# Patient Record
Sex: Female | Born: 1960 | Race: Black or African American | Hispanic: No | Smoking: Never smoker
Health system: Southern US, Community
[De-identification: ages and names within clinical notes are randomized; demographics above are authoritative.]

## PROBLEM LIST (undated history)

## (undated) HISTORY — PX: ABDOMINAL HYSTERECTOMY: SHX81

---

## 1992-03-12 HISTORY — PX: REPLACEMENT TOTAL KNEE: SUR1224

## 1999-09-05 ENCOUNTER — Encounter: Admission: RE | Admit: 1999-09-05 | Discharge: 1999-09-05 | Payer: Self-pay | Admitting: Family Medicine

## 1999-09-05 ENCOUNTER — Encounter: Payer: Self-pay | Admitting: Family Medicine

## 1999-09-26 ENCOUNTER — Encounter: Admission: RE | Admit: 1999-09-26 | Discharge: 1999-09-26 | Payer: Self-pay | Admitting: Family Medicine

## 1999-09-26 ENCOUNTER — Encounter: Payer: Self-pay | Admitting: Family Medicine

## 2000-12-30 ENCOUNTER — Emergency Department (HOSPITAL_COMMUNITY): Admission: EM | Admit: 2000-12-30 | Discharge: 2000-12-30 | Payer: Self-pay | Admitting: Emergency Medicine

## 2000-12-30 ENCOUNTER — Encounter: Payer: Self-pay | Admitting: Emergency Medicine

## 2001-03-28 ENCOUNTER — Other Ambulatory Visit: Admission: RE | Admit: 2001-03-28 | Discharge: 2001-03-28 | Payer: Self-pay | Admitting: Family Medicine

## 2001-04-15 ENCOUNTER — Encounter: Payer: Self-pay | Admitting: Family Medicine

## 2001-04-15 ENCOUNTER — Ambulatory Visit (HOSPITAL_COMMUNITY): Admission: RE | Admit: 2001-04-15 | Discharge: 2001-04-15 | Payer: Self-pay | Admitting: Family Medicine

## 2002-05-25 ENCOUNTER — Ambulatory Visit (HOSPITAL_COMMUNITY): Admission: RE | Admit: 2002-05-25 | Discharge: 2002-05-25 | Payer: Self-pay

## 2002-05-29 ENCOUNTER — Encounter: Admission: RE | Admit: 2002-05-29 | Discharge: 2002-05-29 | Payer: Self-pay

## 2002-07-17 ENCOUNTER — Encounter (INDEPENDENT_AMBULATORY_CARE_PROVIDER_SITE_OTHER): Payer: Self-pay | Admitting: *Deleted

## 2002-07-17 ENCOUNTER — Ambulatory Visit (HOSPITAL_COMMUNITY): Admission: RE | Admit: 2002-07-17 | Discharge: 2002-07-17 | Payer: Self-pay

## 2002-07-28 ENCOUNTER — Inpatient Hospital Stay (HOSPITAL_COMMUNITY): Admission: AD | Admit: 2002-07-28 | Discharge: 2002-07-28 | Payer: Self-pay

## 2002-08-26 ENCOUNTER — Encounter (INDEPENDENT_AMBULATORY_CARE_PROVIDER_SITE_OTHER): Payer: Self-pay | Admitting: *Deleted

## 2002-08-27 ENCOUNTER — Inpatient Hospital Stay (HOSPITAL_COMMUNITY): Admission: AD | Admit: 2002-08-27 | Discharge: 2002-08-28 | Payer: Self-pay

## 2002-09-05 ENCOUNTER — Inpatient Hospital Stay (HOSPITAL_COMMUNITY): Admission: AD | Admit: 2002-09-05 | Discharge: 2002-09-05 | Payer: Self-pay

## 2002-12-30 ENCOUNTER — Other Ambulatory Visit: Admission: RE | Admit: 2002-12-30 | Discharge: 2002-12-30 | Payer: Self-pay | Admitting: Obstetrics and Gynecology

## 2005-08-02 ENCOUNTER — Emergency Department (HOSPITAL_COMMUNITY): Admission: EM | Admit: 2005-08-02 | Discharge: 2005-08-02 | Payer: Self-pay | Admitting: Family Medicine

## 2005-11-08 ENCOUNTER — Ambulatory Visit (HOSPITAL_COMMUNITY): Admission: RE | Admit: 2005-11-08 | Discharge: 2005-11-08 | Payer: Self-pay | Admitting: Chiropractic Medicine

## 2005-12-18 ENCOUNTER — Encounter: Admission: RE | Admit: 2005-12-18 | Discharge: 2005-12-18 | Payer: Self-pay | Admitting: Internal Medicine

## 2006-01-30 ENCOUNTER — Encounter (INDEPENDENT_AMBULATORY_CARE_PROVIDER_SITE_OTHER): Payer: Self-pay | Admitting: Specialist

## 2006-01-30 ENCOUNTER — Ambulatory Visit (HOSPITAL_COMMUNITY): Admission: RE | Admit: 2006-01-30 | Discharge: 2006-01-30 | Payer: Self-pay | Admitting: Obstetrics and Gynecology

## 2006-06-17 ENCOUNTER — Ambulatory Visit (HOSPITAL_COMMUNITY): Admission: RE | Admit: 2006-06-17 | Discharge: 2006-06-17 | Payer: Self-pay | Admitting: Chiropractic Medicine

## 2006-07-28 ENCOUNTER — Inpatient Hospital Stay (HOSPITAL_COMMUNITY): Admission: AD | Admit: 2006-07-28 | Discharge: 2006-07-28 | Payer: Self-pay | Admitting: Obstetrics and Gynecology

## 2006-08-06 ENCOUNTER — Ambulatory Visit: Admission: RE | Admit: 2006-08-06 | Discharge: 2006-08-06 | Payer: Self-pay | Admitting: Gynecologic Oncology

## 2006-08-13 ENCOUNTER — Ambulatory Visit (HOSPITAL_COMMUNITY): Admission: RE | Admit: 2006-08-13 | Discharge: 2006-08-13 | Payer: Self-pay | Admitting: Gynecologic Oncology

## 2006-08-13 ENCOUNTER — Encounter: Payer: Self-pay | Admitting: Gynecologic Oncology

## 2006-08-17 ENCOUNTER — Inpatient Hospital Stay (HOSPITAL_COMMUNITY): Admission: AD | Admit: 2006-08-17 | Discharge: 2006-08-17 | Payer: Self-pay | Admitting: Obstetrics & Gynecology

## 2006-08-27 ENCOUNTER — Ambulatory Visit: Admission: RE | Admit: 2006-08-27 | Discharge: 2006-08-27 | Payer: Self-pay | Admitting: Gynecologic Oncology

## 2006-09-23 ENCOUNTER — Emergency Department (HOSPITAL_COMMUNITY): Admission: EM | Admit: 2006-09-23 | Discharge: 2006-09-24 | Payer: Self-pay | Admitting: Emergency Medicine

## 2007-01-01 ENCOUNTER — Encounter: Admission: RE | Admit: 2007-01-01 | Discharge: 2007-01-01 | Payer: Self-pay | Admitting: Internal Medicine

## 2007-06-03 ENCOUNTER — Emergency Department (HOSPITAL_COMMUNITY): Admission: EM | Admit: 2007-06-03 | Discharge: 2007-06-03 | Payer: Self-pay | Admitting: Emergency Medicine

## 2007-07-08 ENCOUNTER — Encounter: Admission: RE | Admit: 2007-07-08 | Discharge: 2007-07-08 | Payer: Self-pay | Admitting: Internal Medicine

## 2009-05-03 ENCOUNTER — Emergency Department (HOSPITAL_COMMUNITY): Admission: EM | Admit: 2009-05-03 | Discharge: 2009-05-03 | Payer: Self-pay | Admitting: Emergency Medicine

## 2010-07-25 NOTE — Consult Note (Signed)
NAMETESSIA, KASSIN NO.:  [id number]   MEDICAL RECORD NO.:  [id number]          PATIENT TYPE:  OUT   LOCATION:  GYN                          FACILITY:  [facility]   PHYSICIAN:  Paola A. Duard Brady, MD    DATE OF BIRTH:  1960-04-07   DATE OF CONSULTATION:  08/27/2006  DATE OF DISCHARGE:                                 CONSULTATION   Krystal Larson is a 50 year old who was seen in May 2008 by Dr. Cherly Hensen.  At  that time she had a pelvic fullness and tenderness on the right side.  She had an MRI that revealed a 6 x 3 cm ovarian cyst with internal  septations.  Ultrasound in May revealed a 10 x 7 x 6 cm complex right  ovarian mass with a normal CA-125 and CEA.  She was initially seen by [place]  Aug 06, 2006.  On August 13, 2006, she underwent diagnostic laparoscopy and  extensive lysis of adhesions and a right salpingo-oophorectomy.  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  The mass measured approximately 10 cm and extended from sidewall  to sidewall.  The mass was already ruptured at the time a of laparoscopy  as there was hemorrhagic fluid within the pelvis.  Final pathology was  consistent with extensive infarction with hemorrhage felt to be  consistent with torsion.  She comes in today for her postoperative  check.  She states that she is feeling better now than she did before  the surgery.  Her pain is much less now than it was preoperatively and  she is overall doing quite well.  Her energy level is slowly improving.  She denies any nausea or vomiting.  She has return of bowel and bladder  function.   PHYSICAL EXAMINATION:  VITAL SIGNS:  Weight 186 pounds, which is down 5  pounds, blood pressure 122/84.  GENERAL:  Well-nourished, well-developed female in no acute distress.  ABDOMEN:  She has well-healed laparoscopy skin incisions.  Abdomen is  soft and nontender.   ASSESSMENT:  49. A 50 year old status post diagnostic laparoscopy, lysis of      adhesions and right salpingo-oophorectomy for a torsed ovary.  On      the comments of the pathology section, it reveals extensively      infarcted tissue with associated acute inflammation and prominent      hemorrhage.  Given the extensive infarction, the nature of the      process was unclear and they cannot rule out a neoplastic process.      These nebulous findings were discussed with the patient and her      pathology will be sent to [facility] for review, and I will      contact the patient with that review once it is available.  She      will follow up with Dr. Cherly Hensen for routine GYN care.  2. I did speak to Krystal Larson regarding hormone replacement therapy.      She is having mild  vasomotor symptoms and minimal night sweats.      She does not wish to proceed with hormone replacement therapy at      this time.  I did discuss the risks of premature menopause with her      including but not limited to osteopenia and osteoporosis.  She will      discuss this with her primary care physician, and I have      recommended that she have a bone density      study in a year with follow-up and recommendations pending that.      She will be released from our clinic.  She knows that this was not      a malignant process and is very pleased with that.  She will return      to see [place] on a p.r.n. basis.      Paola A. Duard Brady, MD  Electronically Signed     PAG/MEDQ  D:  08/27/2006  T:  08/28/2006  Job:  [id number]   cc:   Maxie Better, M.D.  Fax: [phone]   Telford Nab, R.N.  [address]  [place], [place] [address]   Merlene Laughter. Renae Gloss, M.D.  Fax: [phone]

## 2010-07-25 NOTE — Op Note (Signed)
NAMEKHYRA, VISCUSO NO.:  [id number]   MEDICAL RECORD NO.:  [id number]          PATIENT TYPE:  AMB   LOCATION:  DAY                          FACILITY:  [facility]   PHYSICIAN:  Paola A. Duard Brady, MD    DATE OF BIRTH:  February 16, 1961   DATE OF PROCEDURE:  08/13/2006  DATE OF DISCHARGE:                               OPERATIVE REPORT   PREOPERATIVE DIAGNOSIS:  Persistent right ovarian mass measuring 10 cm.   POSTOPERATIVE DIAGNOSIS:  Persistent right ovarian mass measuring 10 cm.   PROCEDURE:  Diagnostic laparoscopy, lysis of adhesions, right salpingo-  oophorectomy.   SURGEON:  Paola A. Duard Brady, M.D.  Roseanna Rainbow, M.D.   ANESTHESIA:  General.   ESTIMATED BLOOD LOSS:  Less than 100 mL.   URINE OUTPUT:  300 mL.   IV FLUIDS:  1600 mL.   COMPLICATIONS:  None.   SPECIMENS TO PATHOLOGY:  Washings, right tube and ovary.   COMPLICATIONS:  None.   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  The mass measured approximately 10 cm and extended from sidewall  to sidewall.  The mass was already ruptured at the time of laparoscopy.   DESCRIPTION OF PROCEDURE:  The patient was taken to the operating room  and placed in supine position where her arms were tucked at her side  with appropriate precautions to her comfort level. General anesthesia  was then induced.  She was then placed in the dorsal lithotomy position  with all appropriate precautions being taken.  Shoulder blocks were  placed appropriately. The abdomen was prepped in the usual sterile  fashion.  The perineum and vagina were prepped in the usual fashion.  A  Foley catheter was inserted into the bladder under sterile conditions.  A Ray-Tec on a sponge stick was placed in the vagina.  The patient was  then draped in the usual sterile fashion.  A time out was performed to  confirm the patient,  surgeons, and allergies.  0.25% Marcaine was  injected for analgesia.   A transverse infraumbilical incision was made in the skin at an area of  prior incision.  The fascia was cleared and there was an obvious  infraumbilical hernia.  The Apollo port was then placed through it.  The  abdomen was insufflated with CO2 gas.  At this point, and at multiple  points during the case, peak pressures were checked and never did  increase over 15 mmHg.  The patient was then placed in Trendelenburg  position with the findings as above.  Bilateral 5 mm ports were placed  under direct visualization.  A 10/12 suprapubic port was paced in the  usual sterile fashion.  Our attention was first drawn to using a suction  irrigator to remove a significant amount of blood and clot already  present within the pelvis.  We then freed the rectosigmoid colon that  was overlying the mass using sharp and blunt dissection as it peeled off  the rectosigmoid colon.   Attention  was then drawn to the right pelvic sidewall.  The round  ligament was transected and the posterior leaf of the broad ligament was  opened.  The ureter was identified.  A window was made between the IP  and the ureter.  The IP was coagulated with bipolar cautery.  We then  freed the mass from its adhesive disease to the right pelvic sidewall  using sharp dissection.  The cul-de-sac was noted to be free and we were  allowed to circumferentially free up the mass from the anterior bladder  and the left pelvic sidewall and the rectosigmoid colon, again using  sharp dissection.  The mass was then elevated out of the pelvis and the  filmy adhesions to the left pelvic sidewall were taken down using blunt  dissection.  The mass was placed in an EndoCatch bag.  The mass  delivered through the 10/12 port.   The abdomen and pelvis were copiously irrigated with a liter of saline.  Secondary to prior significant adhesive disease, Seprafilm emulsion was   placed in the pelvis.  Lysis of adhesions took one hour.  All pedicles  were inspected under low flow and low pressure.  The ports were removed  under direct visualization.  The fascia of the infraumbilical port was  closed using a running UR6 of 0 Vicryl.  The skin was closed using 4-0  subcuticular.  Steri-Strips were applied.  The sponge stick was removed.   The patient tolerated the procedure well and was taken to the recovery  room in stable condition.  All instrument, needle and Ray-Tec counts  were correct x2.      Paola A. Duard Brady, MD  Electronically Signed     PAG/MEDQ  D:  08/13/2006  T:  08/13/2006  Job:  [id number]   cc:   Maxie Better, M.D.  Fax: [phone]   Telford Nab, R.N.  [address]  [place], [place] [address]   Merlene Laughter. Renae Gloss, M.D.  Fax: [phone]

## 2010-07-25 NOTE — Consult Note (Signed)
Krystal Larson, Krystal Larson NO.:  [id number]   MEDICAL RECORD NO.:  [id number]          PATIENT TYPE:  OUT   LOCATION:  GYN                          FACILITY:  [facility]   PHYSICIAN:  Paola A. Duard Brady, MD    DATE OF BIRTH:  05/11/1960   DATE OF CONSULTATION:  08/06/2006  DATE OF DISCHARGE:                                 CONSULTATION   PRIMARY PHYSICIAN:  Dr. Kellie Shropshire at [facility].   The patient is seen today in consultation at the request of Dr. Cherly Hensen.  Krystal Larson is a 50 year old gravida 4, para 4 who about 4-5 weeks ago  began having some slight right lower quadrant pain.  Last week the pain  increased significantly.  She was seen on Jul 28, 2006 by Dr. Cherly Hensen.  At that time an ultrasound was performed that revealed fullness in the  right side which is tender about 6 cm.  She had an MRI in April that  revealed a 6 x 3 centimeter right ovarian cyst with internal septation.  She had an ultrasound in May that just showed a 10 x 7 x 6 cm complex  right ovarian cyst with a minimal amount of free fluid.  CA-125 was done  and was 5.7, AST was 1.3 and a CEA was 0.5.  It is for this reason that  she is referred to [place] today.  Since that past Sunday her pain has  decreased with ibuprofen and Dilaudid.  She stopped taking the Dilaudid  as it was causing her to have some constipation and her pain is well  managed the ibuprofen alone.  She is eating well.  Denies any fevers,  chills, any nausea or vomiting.   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  She is  undergoing physical therapy.   PAST SURGICAL HISTORY:  In June of 2004 she had a vaginal hysterectomy  for fibroids.  In November of 2007 she had a diagnostic laparoscopy,  LSO, aspiration of a right ovarian cyst, lysis of adhesions for pelvic  pain.  She had right knee surgery 15 years ago.  She does not take  antibiotics.  She has had four vaginal deliveries.   MEDICATIONS:  Ibuprofen  p.r.n.   ALLERGIES:  NONE.   FAMILY HISTORY:  She has a brother with diabetes.  She has two sisters  and two brothers with hypertension as do her parents.  She has two  maternal aunts with ovarian cancer, one in her 58s, one in her 68s.  The  history is somewhat ill defined.  She has had two maternal cousins with  cervical cancer in their 30s.   HEALTH MAINTENANCE:  She is up-to-date on her mammograms with the last  one being in October of 2007.   SOCIAL HISTORY:  She denies the use tobacco or alcohol.  She works in  [employer] of the [employer].   PHYSICAL EXAMINATION:  VITAL SIGNS:  Weight 191 pounds, height 5 feet 4  inches, blood pressure 118/84.  GENERAL:  Well-nourished, alert female  in no acute distress.  NECK:  Supple.  There is no lymphadenopathy, no thyromegaly.  LUNGS:  Were clear to auscultation bilaterally.  CARDIOVASCULAR EXAM:  Regular rate and rhythm.  ABDOMEN:  Is morbidly obese, soft, nontender, nondistended.  No palpable  masses or hepatosplenomegaly.  GROINS:  Are negative for adenopathy.  EXTREMITIES:  There is no edema.  PELVIC:  External genitalia is within normal limits.  Bimanual  examination reveals an 8 cm mass at the vaginal cuff.  There is no  nodularity.   ASSESSMENT:  A 50 year old with persistent right ovarian mass that was  noted when she had her laparoscopic left salpingo-oophorectomy in  November of 2007 and is persistent through this time and increasing in  size.   PLAN:  Discussed operative laparoscopy with the patient.  Risks and  benefits of surgery were discussed with the patient.  Due to her pain  she wishes to proceed.  Risks including bleeding, infection, injury to  surrounding organs were discussed with the patient.  She also  understands that she will be menopausal at that time and there will be  the need of hormone replacement therapy.  Hormone replacement therapy  risks and benefits were discussed with the  patient as well.  We also  discussed that we would start with diagnostic laparoscopy.  She  understands the need for a possible laparotomy either via Pfannenstiel  or a vertical midline incision pending the operative findings and the  amount of adhesive disease.  In addition, we discussed trying to  coordinate the surgery with Dr. Cherly Hensen' schedule, though if Dr.  Cherly Hensen' schedule and our schedule does not allow [place] to perform a  coordinated surgery we will seek the assistance of another OB/GYN in the  community to help during the operative laparoscopy.  Her questions were  elicited and answered to her satisfaction.  She is very pleased with  today's consultation.      Paola A. Duard Brady, MD  Electronically Signed     PAG/MEDQ  D:  08/06/2006  T:  08/06/2006  Job:  [id number]   cc:   Maxie Better, M.D.  Fax: [phone]   Merlene Laughter. Renae Gloss, M.D.  Fax: [phone]   Telford Nab, R.N.  [address]  [place], [place] [address]

## 2010-07-28 NOTE — Discharge Summary (Signed)
   NAME:  CECILIE, [name] NO.:  [id number]   MEDICAL RECORD NO.:  [id number]                   PATIENT TYPE:  INP   LOCATION:  9325                                 FACILITY:  [facility]   PHYSICIAN:  Ronda Fairly. Galen Daft, M.D.              DATE OF BIRTH:  08/24/1960   DATE OF ADMISSION:  08/26/2002  DATE OF DISCHARGE:  08/28/2002                                 DISCHARGE SUMMARY   ADMISSION DIAGNOSIS:  Pelvic pain.   PRINCIPAL DIAGNOSIS:  Pelvic pain.   SECONDARY DIAGNOSIS:  Abnormal uterine bleeding, dysmenorrhea, and  adenomyosis and cervical dysplasia.   COMPLICATIONS THIS HOSPITALIZATION:  None.   CONDITION ON DISCHARGE:  Stable.   HOSPITAL COURSE:  The patient was admitted on the 16th of June for  laparoscopic-assisted vaginal hysterectomy.  She had the month prior failed  laparoscopy and hysteroscopy for pelvic pain.  She had continued pelvic pain  with bleeding despite this and hormone therapy was not effective.  The  patient had a second opinion prior to surgery as well.  The surgery was  carried out without difficulty on the 16th.  It was a laparoscopic-assisted  vaginal hysterectomy.  Ovaries were left in place and not removed.  The  patient did well in the postoperative course and she did have significant  anemia which stabilized prior to discharge.  Her hemoglobin was down in the  6 gram range on postoperative day #2 at 6.6.  She had a final hemoglobin  performed.  The patient wished to be discharged on that day and the final  hemoglobin showed that it was stable and not having any further decrease.  The patient was given discharge instructions regarding activity limits,  wound care, follow up in the office, medication, and she was to follow up in  the office in two weeks.  The patient had discharge medications of iron  tablets and Percocet.  Her final hemoglobin at discharge was 7 grams and  vital signs were stable and afebrile.  Her physical  examination on the day  of discharge was unremarkable.  She was feeling well, tolerating a regular  diet, had normal return of bowel function, and urinating without difficulty.                                               Ronda Fairly. Galen Daft, M.D.    NJT/MEDQ  D:  10/17/2002  T:  10/17/2002  Job:  [id number]

## 2010-07-28 NOTE — Op Note (Signed)
NAME:  Krystal Larson, Krystal Larson NO.:  [id number]   MEDICAL RECORD NO.:  [id number]                   PATIENT TYPE:  AMB   LOCATION:  SDC                                  FACILITY:  [facility]   PHYSICIAN:  Ronda Fairly. Galen Daft, M.D.              DATE OF BIRTH:  Dec 13, 1960   DATE OF PROCEDURE:  08/26/2002  DATE OF DISCHARGE:                                 OPERATIVE REPORT   PREOPERATIVE DIAGNOSES:  1. Pelvic pain.  2. Menometrorrhagia.  3. Cervical intraepithelial neoplasia I.   POSTOPERATIVE DIAGNOSES:  1. Pelvic pain.  2. Menometrorrhagia.  3. Cervical intraepithelial neoplasia I.   PROCEDURE:  Laparoscopically-assisted vaginal hysterectomy.   SURGEON:  Ronda Fairly. Galen Daft, M.D.   ASSISTANT:  Raynald Kemp, M.D.   ANESTHESIA:  General.   COMPLICATIONS:  None.   ESTIMATED BLOOD LOSS:  200 mL.   OPERATIVE REPORT:  The patient was identified as Krystal Larson.  Informed  consent was obtained.  Time out was performed.  Preoperative antibiotics  were given, Betadine prep, sterile technique, bladder catheterized and  emptied.  The first part of the procedure was the laparoscopic part.  It was  a 5 mm in the umbilical site, a 10/12 in the right flank, and a 5 mm in the  left flank.  These were done after carbon dioxide insufflation using Veress  needle and ascertaining that the placement was correct.  There was no trauma  on the entry to the abdomen with any of the trocars.  The upper abdomen had  some omental adhesions.  The lower abdomen was free of adhesions.  The  uterus was slightly enlarged, consistent with possible adenomyosis,  otherwise unremarkable.  There were no lesions on the ovaries.  The utero-  ovarian ligament was coagulated with the tripolar forceps on both right and  left sides after coagulation of the round ligament and development of the  bladder flap.  The utero-ovarian ligament on the right side was vascular and  unable to be transected readily  because of its bulkiness and vascularity;  therefore, I switched to an Endo-GIA vascular staple for securing the blood  supply.  This went very well and was done without difficulty.  Care was  taken to avoid adjacent vital structures, including the ureter and bladder.  The left side was done at the upper pole as well.  the areas were completely  hemostatic, mostly performed from the LAVH standpoint with utero-ovarian  ligament and the earliest portion of the broad ligament/cardinal ligament.  The uterine vessels were not taken from the laparoscopic approach, rather  from the vaginal approach.  The upper poles were freed, and there was no  active bleeding.  The ureters were freely peristalsing and of normal caliber  and separate and distinct from the operative sites.  Next the top was  straight and the surgeons went to the vaginal  approach and the posterior cul-  de-sac was entered without difficulty using sharp Mayo scissors and  retractors where appropriate.  A long weighted speculum was then placed in  and the uterosacral ligaments were clamped, divided, and ligated with 0  Vicryl suture.  These were held for later support.  The anterior cul-de-sac  was entered without difficulty.  The bladder remained without any injury.  The cardinal ligaments and uterine arteries are clamped, X'd with the Heaney  clamp, divided, and ligated with 0 Vicryl.  Another clamp up the cardinal  ligament and broad ligament led to completion of the procedure on either  side.  These also were ligated.  The uterus was then removed with the  cervix.  The posterior vaginal cuff was closed with running locking, and  there was complete hemostasis.  The remainder of the cuff was closed with  the interrupted sutures.  Next this procedure was then completed.  There was  no active bleeding.  The Foley catheter was draining clear urine.  The upper  portion was then performed by reinspection after re-gowning and re-gloving   by the surgeon.  There was no bleeding.  The utero-ovarian ligaments and  vaginal portions of the procedure were completely hemostatic.  The ureters  were normal caliber and freely peristalsing.  There was no evidence of  trauma.  The instruments were then removed under direct visualization and  deflation of carbon dioxide gas.  The 10/12 mm site was closed with the  fascia with a 0 Vicryl suture, followed by closure of the skin with 3-0  Monocryl.  The other two 5 mm sites were closed with 3-0 Monocryl.  There  were no complications from the procedure.  She left the operating room in  stable condition.  All instrument, sponge, and needle counts were correct  throughout the case.                                               Ronda Fairly. Galen Daft, M.D.    NJT/MEDQ  D:  08/26/2002  T:  08/26/2002  Job:  [id number]   cc:   Gretta Arab. Valentina Lucks, M.D.  [address] [place]  [place] [address]  Fax: [phone]

## 2010-07-28 NOTE — Op Note (Signed)
NAME:  Krystal Larson, Krystal Larson NO.:  [id number]   MEDICAL RECORD NO.:  [id number]                   PATIENT TYPE:  AMB   LOCATION:  SDC                                  FACILITY:  [facility]   PHYSICIAN:  Ronda Fairly. Galen Daft, M.D.              DATE OF BIRTH:  29-Jul-1960   DATE OF PROCEDURE:  07/17/2002  DATE OF DISCHARGE:  07/17/2002                                 OPERATIVE REPORT   PREOPERATIVE DIAGNOSIS:  Pelvic pain, abnormal uterine bleeding.   POSTOPERATIVE DIAGNOSIS:  Pelvic pain, abnormal uterine bleeding, suspect  adenomyosis.   PROCEDURE:  Diagnostic laparoscopy and hysteroscopy with biopsy.   SURGEON:  Ronda Fairly. Galen Daft, M.D.   COMPLICATIONS:  None.   ANESTHESIA:  General.   ESTIMATED BLOOD LOSS:  Less than 5 cc.   HISTORY OF PRESENT ILLNESS:  The patient was identified at [facility]. She  received preoperative antibiotics prior to  going to the operating room. The  anesthesia was general. Betadine prep was utilized. The bladder was  catheterized and emptied at the beginning of the procedure.   The uterus was examined under anesthesia and was found to be approximately 6  to 8 week size, otherwise unremarkable, no adnexal masses. The single tooth  tenaculum with Jarcho type tenaculum was utilized for uterine manipulation.   The abdomen was entered at the umbilical site with a Verres needle first and  its position was checked and found to be correct using saline testing. The  abdomen was inflated with CO2 gas. The single trocar 10 mm was placed in the  umbilical site without trauma. Pictures were taken of the upper abdomen  which were unremarkable, the bowel surfaces which were unremarkable and the  uterine surfaces. There was no evidence of endometriosis. Both ovaries were  unremarkable. There was no evidence of problems in the posterior cul-de-sac.  No unusual adhesions. The uterus itself was somewhat globular and enlarged,  otherwise  unremarkable.   This part of the procedure was then completed and showed no specific source  for her pelvic pain other than her possible adenomyosis. The laparoscopy  portion being completed, the skin was infiltrated with 0.25% Marcaine,  1:100,000 epinephrine with a total of  8 cc. The fascia was closed with 0  Vicryl and the skin itself was closed with Dermabond.   The vaginal part of the surgery was then begun after the top was draped. The  hysteroscopy was followed out as follows. First the diagnostic hysteroscope  was utilized. There were some fluffy areas and some possible fibroids.  However, placing the operative hysteroscope in to look for this fibroid has  been an illusion from the smaller scope. The larger scope with its better  fluid flow dynamics was able to show that the cavity just had some  proliferative type endometrium, and this was sampled to exclude any source  of premalignant or  malignant condition. The curet was also utilized prior to  this and the polyp forceps.   There was no active bleeding. The patient tolerated the procedure very well  and left the operating room in stable condition. All sponge, instrument and  needle counts were correct throughout the case. There were no complications.                                                  Ronda Fairly. Galen Daft, M.D.    NJT/MEDQ  D:  07/17/2002  T:  07/20/2002  Job:  [id number]

## 2010-07-28 NOTE — Op Note (Signed)
NAMEJOSEY, Krystal Larson NO.:  [id number]   MEDICAL RECORD NO.:  [id number]          PATIENT TYPE:  AMB   LOCATION:  SDC                           FACILITY:  [facility]   PHYSICIAN:  Krystal Larson, Krystal LarsonDATE OF BIRTH:  [birth year]   DATE OF PROCEDURE:  01/30/2006  DATE OF DISCHARGE:                               OPERATIVE REPORT   PREOPERATIVE DIAGNOSIS:  Chronic left lower quadrant pain.   PROCEDURE:  Open laparoscopy, lysis of adhesions, left salpingo-  oophorectomy, aspiration of right ovarian cyst.   POSTOPERATIVE DIAGNOSIS:  Chronic left lower quadrant pain, bilateral  ovarian cysts and pelvic adhesions.   ANESTHESIA:  General.   SURGEON:  Krystal Larson, M.D.   ASSESSMENT:  Krystal Larson, M.D.   PROCEDURE:  Under adequate general anesthesia, the patient is placed in  the dorsolithotomy position.  She was sterilely prepped and draped in  the usual fashion.  Bladder was catheterized for a moderate amount of  urine.  A lung clamp with a sponge was placed in the abdomen due to the  patient's prior history of a hysterectomy.  Attention was then turned to  the abdomen, a quarter percent Marcaine was injected infraumbilically.  An infraumbilical incision was then made.  Veress needle was introduced.  The placement was tested.  3-1/2 liters of carbon dioxide was  insufflated.  Veress needle was then removed.  A 10 mm disposable trocar  with sleeve was introduced in the abdomen without incident.  A lighted  video laparoscope was introduced.  Examination of the abdomen was  notable for no evidence of damage on entering the cavity.  Normal liver  edge.  Some omental adhesion to the anterior abdominal wall.  A second  portal site was obtained by a small incision suprapubically and the 5 mm  port being placed under direct visualization.  Using the probe, the  pelvis was inspected.  Both ovaries had a cystic appearance, the left  much greater than the right.   Other than evidence of prior tubal  sterilization, the tubes were otherwise unremarkable.  There was some  adhesions around both tubes filmy in nature which were lysed. The cul-de-  sac vaginal cuff area did not show any evidence of endometriotic  implants.  Both tubes and ovary were then mobile.  Since the patient's  pain was on the left and there was not a marked amount of scar tissue to  explain in of itself the chronic pain, decision was made to proceed with  removal of left tube and ovary.  With that in mind, a third site was  placed in the left lateral quadrant under direct visualization.  The  ureter on the left was identified.  The infundibulopelvic ligament was  serially cauterized and cut using the gyrus instrument until the ovary  was separated from its attachment.  At that point a 5 mm scope was  placed and left lower lateral quadrant probed.  The other video  laparoscope was removed through the umbilical port.  The Endoloop bag  was placed.  The left tube and ovary  was placed into the bag/pouch and  through the suprapubic port aspiration of the cyst that was within the  ovary was performed in order to facilitate removal of the ovary.  The  specimen was then brought up to the umbilical area but due to the small  opening was not able to initially be removed until the fascia was opened  further and the tube and ovary was then removed.  The Hasson cannula was  then introduced through the infraumbilical port.  The lighted video  laparoscope was then reinserted.  The left lateral quadrant 5 mm scope  was removed using lower ports.  The pelvis was inspected.  The adhesions  were then lysed.  The right tube and ovary were reinspected.  The cystic  mass on the ovary on the right appeared to be probably functional;  however, the right ovary and tube were stabilized and a aspirator was  then utilized to puncture the right ovarian cyst and aspiration of clear  yellow fluid was then done.   Additional adhesions were then lysed around  that tube was well.  The pelvis was irrigated, suctioned and was felt to  have adequate hemostasis and decision was then made to terminate the  procedure.  The lower ports were then removed.  The abdomen was then  deflated and the fascia was identified and closed with 0 Vicryl suture  and the infraumbilical subcutaneous areas was approximated using 4-0  Vicryl sutures.  The lower ports were closed with Dermabond.  The sponge  stick  in vagina was removed.  Specimen was a left tube and ovary sent  to pathology.  Estimated blood loss was minimal.  Complication was none.  Sponge and instrument counts x2 was correct.  Intraoperative fluid was  per the anesthesia record.  The patient tolerated the procedure well was  transferred to recovery in stable condition.      Krystal Larson, M.D.  Electronically Signed     [place]/MEDQ  D:  01/30/2006  T:  01/31/2006  Job:  [id number]

## 2010-12-04 LAB — DIFFERENTIAL
Basophils Absolute: 0
Basophils Relative: 1
Lymphocytes Relative: 41
Lymphs Abs: 1.9
Neutro Abs: 2.4
Neutrophils Relative %: 51

## 2010-12-04 LAB — POCT I-STAT, CHEM 8
BUN: 14
HCT: 41
Hemoglobin: 13.9
Sodium: 141

## 2010-12-04 LAB — TSH: TSH: 0.536

## 2010-12-04 LAB — CBC
HCT: 38.6
RBC: 4.72
WBC: 4.7

## 2013-11-12 ENCOUNTER — Other Ambulatory Visit: Payer: Self-pay

## 2013-11-12 DIAGNOSIS — Z1231 Encounter for screening mammogram for malignant neoplasm of breast: Secondary | ICD-10-CM

## 2013-11-24 ENCOUNTER — Ambulatory Visit
Admission: RE | Admit: 2013-11-24 | Discharge: 2013-11-24 | Disposition: A | Discharge status: Home / Self Care | Payer: Medicaid Other | Source: Ambulatory Visit

## 2013-11-24 DIAGNOSIS — Z1231 Encounter for screening mammogram for malignant neoplasm of breast: Secondary | ICD-10-CM

## 2014-02-24 ENCOUNTER — Emergency Department (HOSPITAL_COMMUNITY)
Admission: EM | Admit: 2014-02-24 | Discharge: 2014-02-24 | Disposition: A | Discharge status: Home / Self Care | Payer: Medicaid Other | Source: Home / Self Care | Attending: Emergency Medicine | Admitting: Emergency Medicine

## 2014-02-24 ENCOUNTER — Encounter (HOSPITAL_COMMUNITY): Payer: Self-pay | Admitting: *Deleted

## 2014-02-24 DIAGNOSIS — J029 Acute pharyngitis, unspecified: Secondary | ICD-10-CM

## 2014-02-24 LAB — POCT RAPID STREP A: Streptococcus, Group A Screen (Direct): NEGATIVE

## 2014-02-24 LAB — GLUCOSE, CAPILLARY: Glucose-Capillary: 94 mg/dL (ref 70–99)

## 2014-02-24 MED ORDER — DICLOFENAC SODIUM 75 MG PO TBEC: 75.0000 mg | DELAYED_RELEASE_TABLET | Freq: Two times a day (BID) | ORAL | Status: AC

## 2014-02-24 NOTE — ED Notes (Addendum)
C/o sore throat onset early Monday morning, not any worse but not any better.  When she bends over she feels like she is going fall over onset Monday.  C/o pain and fullness in both ears.  C/o tingling in fingers of both hands off and on for 6 mos.  Notices it 2-3 times / week for about 30 min.-1 hr. C/o thirst for 1 yr and urinates frequently. Brother has diabetes.

## 2014-02-24 NOTE — Discharge Instructions (Signed)

## 2014-02-24 NOTE — ED Provider Notes (Signed)
   Chief Complaint   Sore Throat   History of Present Illness   Krystal Larson is a 53 year old female who's had a three-day history of sore throat, bilateral ear pain, chills, and headache. She denies any fever, rhinorrhea, nasal congestion, cough, or GI symptoms. 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  She also wanted to be checked for diabetes today. She states she's had excessive thirst, polyuria, and tingling of her hands. Her brother has diabetes.   Review of Systems   Other than as noted above, the patient denies any of the following symptoms. Systemic:  No fever, chills, sweats, myalgias, or headache. Eye:  No redness, pain or drainage. ENT:  No earache, nasal congestion, sneezing, rhinorrhea, sinus pressure, sinus pain, or post nasal drip. Lungs:  No cough, sputum production, wheezing, shortness of breath, or chest pain. GI:  No abdominal pain, nausea, vomiting, or diarrhea. Skin:  No rash.  PMFSH   Past medical history, family history, social history, meds, and allergies were reviewed.   Physical Exam     Vital signs:  There were no vitals taken for this visit. General:  Alert, in no distress. Phonation was normal, no drooling, and patient was able to handle secretions well.  Eye:  No conjunctival injection or drainage. Lids were normal. ENT:  TMs and canals were normal, without erythema or inflammation.  Nasal mucosa was clear and uncongested, without drainage.  Mucous membranes were moist.  Exam of pharynx shows moderate erythema but no swelling or exudate.  There were no oral ulcerations or lesions. There was no bulging of the tonsillar pillars, and the uvula was midline. Neck:  Supple, no adenopathy, tenderness or mass. Lungs:  No respiratory distress.  Lungs were clear to auscultation, without wheezes, rales or rhonchi.  Breath sounds were clear and equal bilaterally.  Heart:  Regular rhythm,  without gallops, murmers or rubs. Skin:  Clear, warm, and dry, without rash or lesions.  Labs   Results for orders placed or performed during the hospital encounter of 02/24/14  Glucose, capillary  Result Value Ref Range   Glucose-Capillary 94 70 - 99 mg/dL  POCT rapid strep A [facility]  Result Value Ref Range   Streptococcus, Group A Screen (Direct) NEGATIVE NEGATIVE   Assessment   The encounter diagnosis was Viral pharyngitis.  There is no evidence of a peritonsillar abscess, retropharyngeal abscess, or epiglottitis.  There is no evidence of diabetes, but I suggested the patient get a hemoglobin A1c with her primary care physician.  Plan     1.  Meds:  The following meds were prescribed:   New Prescriptions   DICLOFENAC (VOLTAREN) 75 MG EC TABLET    Take 1 tablet (75 mg total) by mouth 2 (two) times daily.    2.  Patient Education/Counseling:  The patient was given appropriate handouts, self care instructions, and instructed in symptomatic relief, including hot saline gargles, throat lozenges, infectious precautions, and need to trade out toothbrush.    3.  Follow up:  The patient was told to follow up here if no better in 3 to 4 days, or sooner if becoming worse in any way, and given some red flag symptoms such as difficulty swallowing or breathing which would prompt immediate return.       Reuben Likesavid C Jonahtan Manseau, MD 02/24/14 [phone]

## 2014-02-26 LAB — CULTURE, GROUP A STREP

## 2014-02-26 NOTE — ED Notes (Signed)
Final report of step culture screening : negative for group A&B , no further action required

## 2016-11-08 ENCOUNTER — Ambulatory Visit (HOSPITAL_COMMUNITY)
Admission: EM | Admit: 2016-11-08 | Discharge: 2016-11-08 | Disposition: A | Discharge status: Home / Self Care | Payer: BC Managed Care – PPO | Attending: Family Medicine | Admitting: Family Medicine

## 2016-11-08 ENCOUNTER — Encounter (HOSPITAL_COMMUNITY): Payer: Self-pay | Admitting: *Deleted

## 2016-11-08 DIAGNOSIS — L304 Erythema intertrigo: Secondary | ICD-10-CM

## 2016-11-08 DIAGNOSIS — L209 Atopic dermatitis, unspecified: Secondary | ICD-10-CM | POA: Diagnosis not present

## 2016-11-08 MED ORDER — CLOTRIMAZOLE 1 % EX CREA: TOPICAL_CREAM | CUTANEOUS | 1 refills | Status: AC

## 2016-11-08 MED ORDER — TRIAMCINOLONE ACETONIDE 0.1 % EX CREA: 1.0000 "application " | TOPICAL_CREAM | Freq: Two times a day (BID) | CUTANEOUS | 0 refills | Status: AC

## 2016-11-08 MED ORDER — HYDROCORTISONE 2.5 % EX LOTN
TOPICAL_LOTION | Freq: Two times a day (BID) | CUTANEOUS | 0 refills | Status: AC
Start: 2016-11-08 — End: ?

## 2016-11-08 NOTE — ED Triage Notes (Signed)
Pt  Reports   Rash   On  Both  Arms      Both  Arms   And  abd    And  Under  Breast    Pt  Denies  Any  Known  Causative   Agent       No  Angioedema  Sitting       Upright   Speaking   In  Complete   sentances

## 2016-11-08 NOTE — ED Provider Notes (Signed)
  [facility]   [id number] 11/08/16 Arrival Time: 1641  ASSESSMENT & PLAN:  1. Atopic dermatitis, unspecified type   2. Intertrigo     Meds ordered this encounter  Medications  . hydrocortisone 2.5 % lotion    Sig: Apply topically 2 (two) times daily. Use sparingly.    Dispense:  59 mL    Refill:  0  . clotrimazole (LOTRIMIN) 1 % cream    Sig: Apply to affected area 2 times daily for 2-4 weeks    Dispense:  113 g    Refill:  1  . triamcinolone cream (KENALOG) 0.1 %    Sig: Apply 1 application topically 2 (two) times daily. Eczema.    Dispense:  453.6 g    Refill:  0   Clotrimazole and triamcinolone for eczema. Hydrocortisone cream for intertrigo. Will f/u with PCP if not improving.  Reviewed expectations re: course of current medical issues. Questions answered. Outlined signs and symptoms indicating need for more acute intervention. Patient verbalized understanding. After Visit Summary given.   SUBJECTIVE:  Krystal Larson is a 56 y.o. female who presents with complaint of rashes. H/O eczema and has this on her arms and neck for several weeks. For the past 1-2 months reports itchy rash under breasts. No OTC treatment. Afebrile.  ROS: As per HPI.   OBJECTIVE:  Vitals:   11/08/16 1740  BP: 122/70  Pulse: 81  Resp: 18  Temp: 98.3 F (36.8 C)  TempSrc: Oral  SpO2: 99%    General appearance: alert; no distress Skin: upper extremities and neck with atopic dermatitis; under breasts has intertrigo Psychological: alert and cooperative; normal mood and affect   No Known Allergies   Social History   Social History  . Marital status: Widowed    Spouse name: N/A  . Number of children: N/A  . Years of education: N/A   Occupational History  . Not on file.   Social History Main Topics  . Smoking status: Never Smoker  . Smokeless tobacco: Not on file  . Alcohol use No  . Drug use: No  . Sexual activity: Not on file   Other Topics Concern  . Not on  file   Social History Narrative  . No narrative on file   Family History  Problem Relation Age of Onset  . Thyroid disease Mother   . Kidney disease Mother   . Stroke Father   . Hypertension Father   . Heart disease Father   . AAA (abdominal aortic aneurysm) Father   . Diabetes Brother   . Diabetes Brother    Past Surgical History:  Procedure Laterality Date  . ABDOMINAL HYSTERECTOMY     fibroids and partial 2007, benign mass removed and complete 2008  . REPLACEMENT TOTAL KNEE Right 1994     Mardella LaymanHagler, Zanobia Griebel, MD 11/10/16 1055

## 2016-12-03 ENCOUNTER — Other Ambulatory Visit: Payer: Self-pay | Admitting: Occupational Medicine

## 2016-12-03 ENCOUNTER — Ambulatory Visit: Payer: Self-pay

## 2016-12-03 DIAGNOSIS — M25512 Pain in left shoulder: Secondary | ICD-10-CM

## 2016-12-03 DIAGNOSIS — M25511 Pain in right shoulder: Secondary | ICD-10-CM

## 2019-01-27 IMAGING — DX DG SHOULDER 2+V*L*
4 series · 4 of 4 positions shown · non-contrast
Comparison: None.

CLINICAL DATA: Left shoulder pain

EXAM:
LEFT SHOULDER - 2+ VIEW

[shoulder ap (1 of 2)]
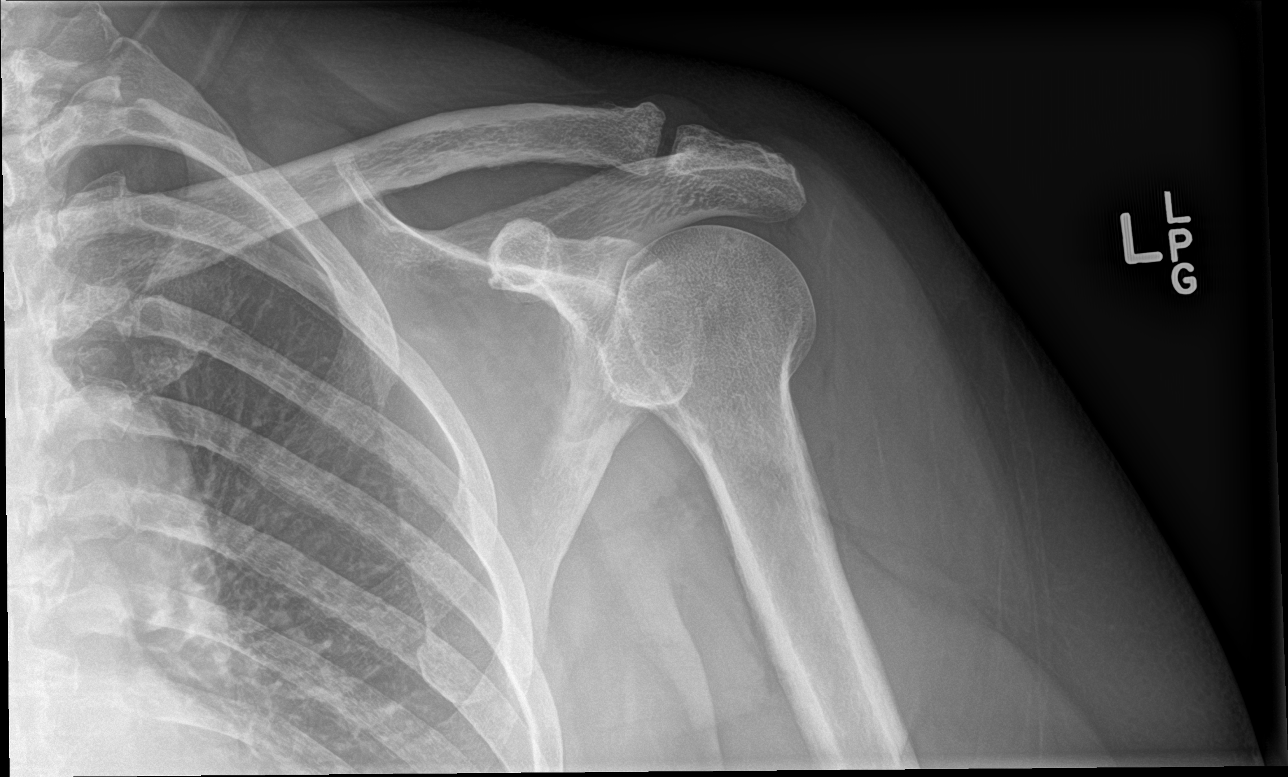

[shoulder ap (2 of 2)]
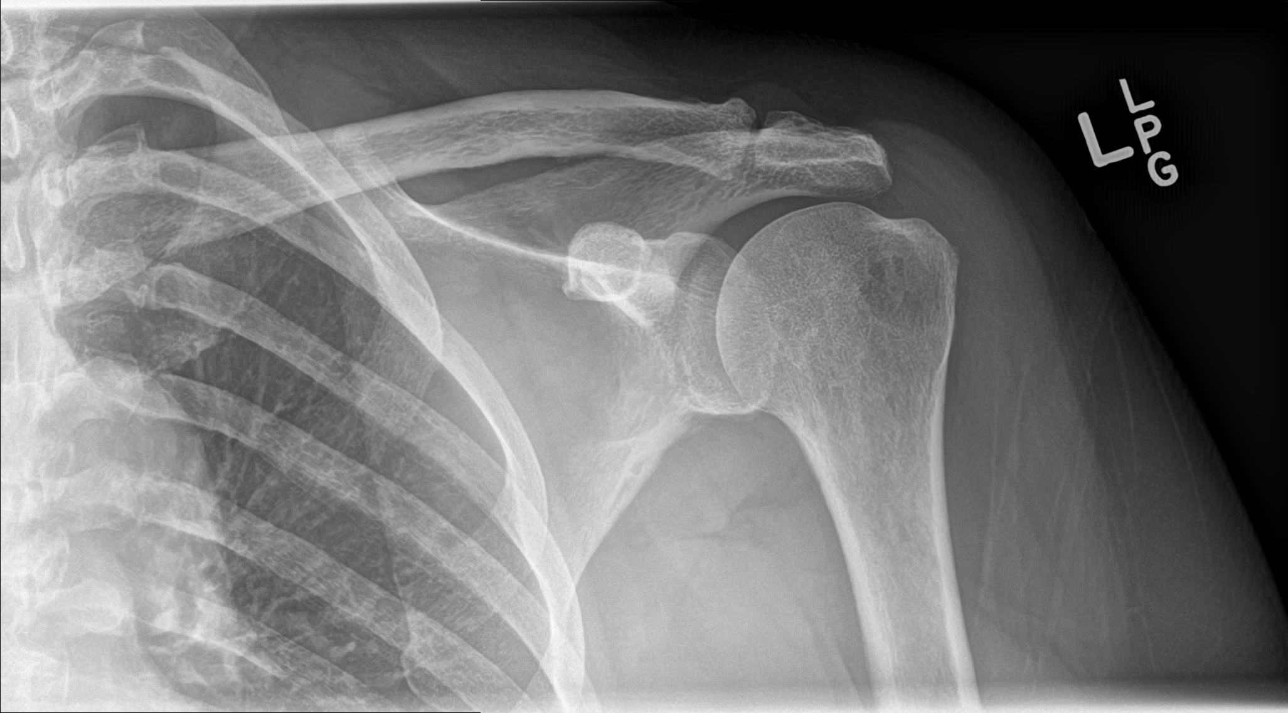

[shoulder y-view]
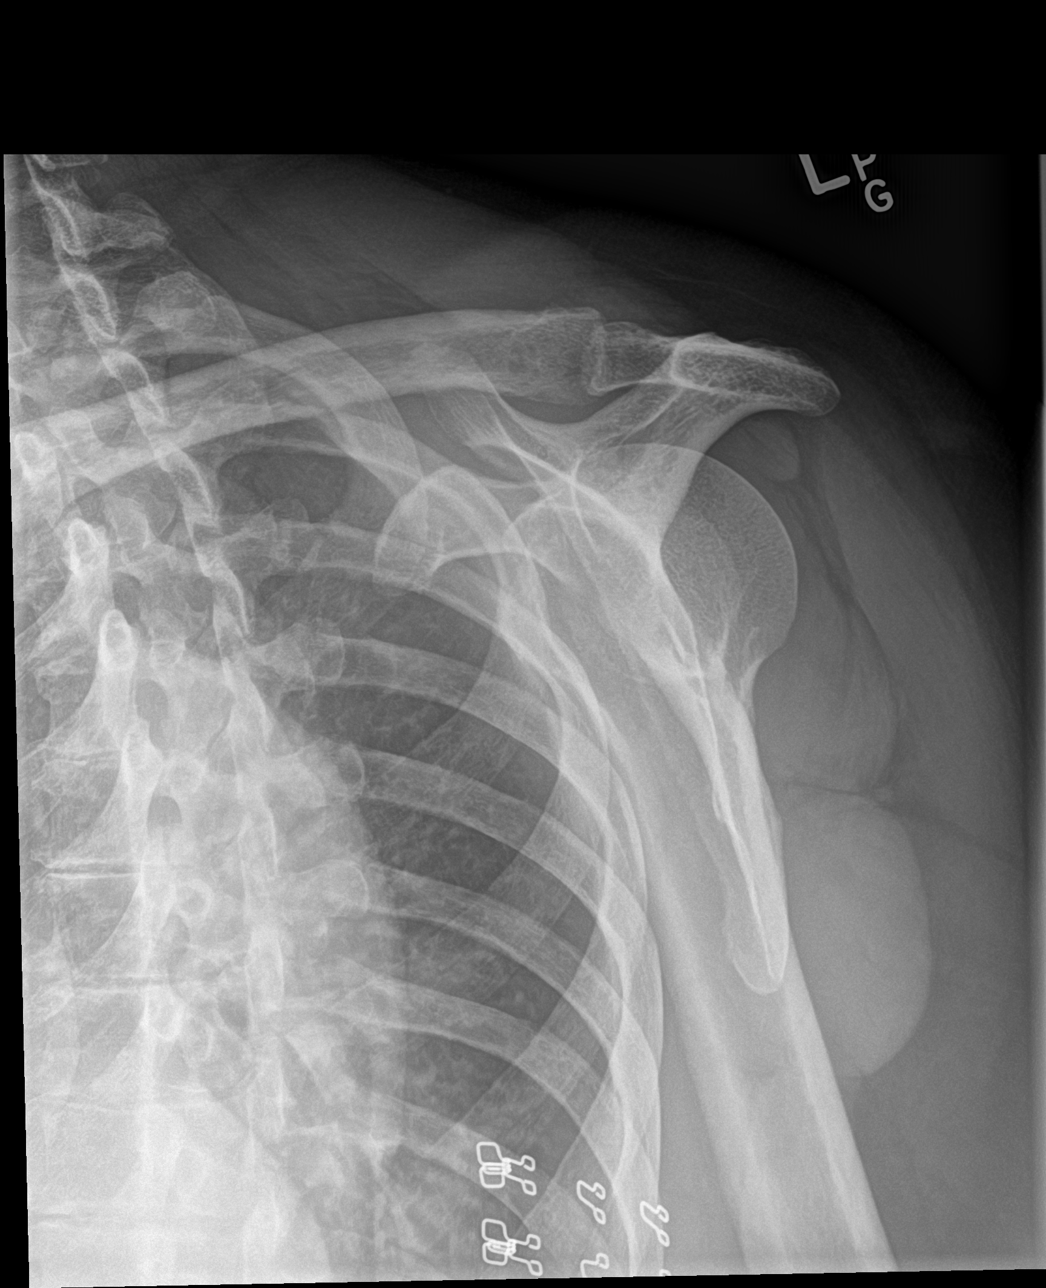

[shoulder axial]
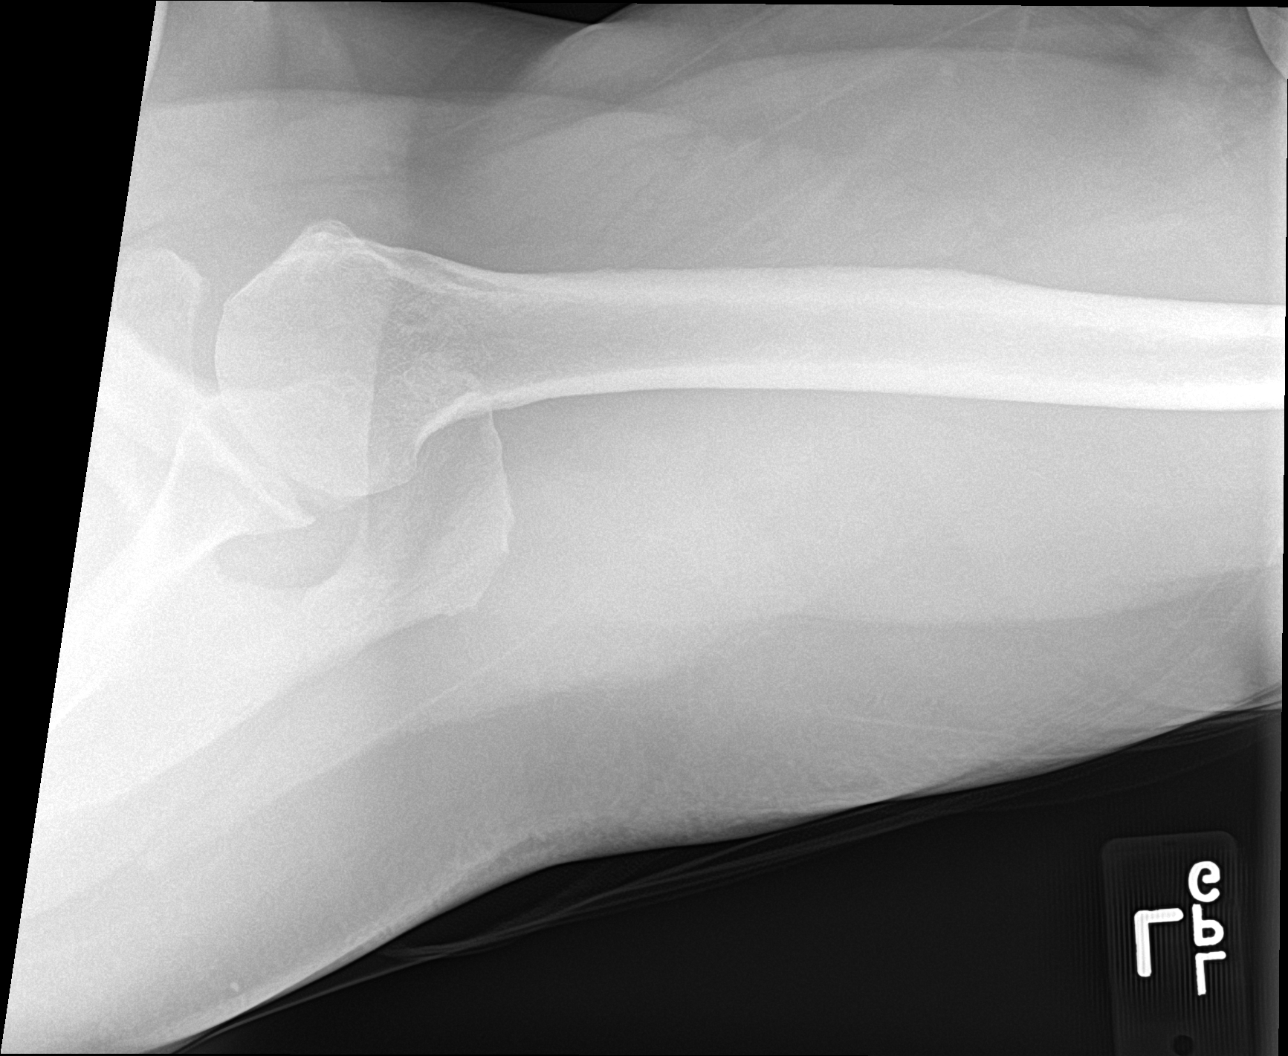

[4 of 4 positions shown; findings below may reference images not displayed]

FINDINGS: No acute fracture or malalignment. Mild AC joint degenerative
changes. Left lung apex is clear
IMPRESSION: 1. No acute osseous abnormality
2. Mild AC joint degenerative change

## 2019-03-12 ENCOUNTER — Other Ambulatory Visit: Payer: Self-pay

## 2019-03-12 DIAGNOSIS — Z20822 Contact with and (suspected) exposure to covid-19: Secondary | ICD-10-CM

## 2019-03-14 LAB — NOVEL CORONAVIRUS, NAA: SARS-CoV-2, NAA: DETECTED — AB

## 2020-08-28 ENCOUNTER — Ambulatory Visit (HOSPITAL_COMMUNITY)
Admission: EM | Admit: 2020-08-28 | Discharge: 2020-08-28 | Disposition: A | Discharge status: Home / Self Care | Payer: BC Managed Care – PPO | Attending: Internal Medicine | Admitting: Internal Medicine

## 2020-08-28 ENCOUNTER — Encounter (HOSPITAL_COMMUNITY): Payer: Self-pay

## 2020-08-28 DIAGNOSIS — M5431 Sciatica, right side: Secondary | ICD-10-CM

## 2020-08-28 DIAGNOSIS — M5432 Sciatica, left side: Secondary | ICD-10-CM | POA: Diagnosis not present

## 2020-08-28 MED ORDER — KETOROLAC TROMETHAMINE 30 MG/ML IJ SOLN
30.0000 mg | Freq: Once | INTRAMUSCULAR | Status: AC
  Administered 2020-08-28: 30 mg via INTRAMUSCULAR

## 2020-08-28 MED ORDER — KETOROLAC TROMETHAMINE 30 MG/ML IJ SOLN
INTRAMUSCULAR | Status: AC
  Filled 2020-08-28: qty 1

## 2020-08-28 MED ORDER — TIZANIDINE HCL 4 MG PO TABS: 4.0000 mg | ORAL_TABLET | Freq: Every evening | ORAL | 0 refills | Status: AC | PRN

## 2020-08-28 MED ORDER — IBUPROFEN 600 MG PO TABS: 600.0000 mg | ORAL_TABLET | Freq: Four times a day (QID) | ORAL | 0 refills | Status: AC | PRN

## 2020-08-28 NOTE — ED Triage Notes (Signed)
Pt presents with left leg and hip pain x 4 days. Pt states when she lays down she feels a pulling pain.

## 2020-08-28 NOTE — Discharge Instructions (Signed)
Entered range of motion exercises Take medications as prescribed Heating pad on a 10 minutes on-10 minutes off cycle If symptoms worsen please return to the urgent care There is no indication for x-ray of your back.

## 2020-08-29 NOTE — ED Provider Notes (Signed)
[facility]    CSN: [id number] Arrival date & time: 08/28/20  1650      History   Chief Complaint Chief Complaint  Patient presents with   Leg Pain    left    HPI Krystal Larson is a 60 y.o. female comes to the urgent care with left-sided back pain of 4 days duration.  Patient said onset was insidious and has been persistent.  Patient denies any trauma.  No deformity.  Pain is aggravated by movement and palpation over the lower back.  No known relieving factors.  Pain radiates into the left thigh.  Pain is currently 10 out of 10 and is associated with some left thigh numbness.  No skin changes or erythema.   HPI  History reviewed. No pertinent past medical history.  There are no problems to display for this patient.   Past Surgical History:  Procedure Laterality Date   ABDOMINAL HYSTERECTOMY     fibroids and partial 2007, benign mass removed and complete 2008   REPLACEMENT TOTAL KNEE Right 1994    OB History   No obstetric history on file.      Home Medications    Prior to Admission medications   Medication Sig Start Date End Date Taking? Authorizing Provider  ibuprofen (ADVIL) 600 MG tablet Take 1 tablet (600 mg total) by mouth every 6 (six) hours as needed. 08/28/20  Yes Chidiebere Wynn, Britta Mccreedy, MD  tiZANidine (ZANAFLEX) 4 MG tablet Take 1 tablet (4 mg total) by mouth at bedtime as needed for muscle spasms. 08/28/20  Yes Markell Schrier, Britta Mccreedy, MD  clotrimazole (LOTRIMIN) 1 % cream Apply to affected area 2 times daily for 2-4 weeks 11/08/16   Mardella Layman, MD  diclofenac (VOLTAREN) 75 MG EC tablet Take 1 tablet (75 mg total) by mouth 2 (two) times daily. 02/24/14   Reuben Likes, MD  hydrocortisone 2.5 % lotion Apply topically 2 (two) times daily. Use sparingly. 11/08/16   Mardella Layman, MD  triamcinolone cream (KENALOG) 0.1 % Apply 1 application topically 2 (two) times daily. Eczema. 11/08/16   Mardella Layman, MD    Family History Family History  Problem Relation  Age of Onset   Thyroid disease Mother    Kidney disease Mother    Stroke Father    Hypertension Father    Heart disease Father    AAA (abdominal aortic aneurysm) Father    Diabetes Brother    Diabetes Brother     Social History Social History   Tobacco Use   Smoking status: Never   Smokeless tobacco: Never  Substance Use Topics   Alcohol use: No   Drug use: No     Allergies   Patient has no known allergies.   Review of Systems Review of Systems  Musculoskeletal:  Positive for back pain. Negative for arthralgias, gait problem, joint swelling and myalgias.  Skin:  Negative for color change, pallor and wound.    Physical Exam Triage Vital Signs ED Triage Vitals  Enc Vitals Group     BP 08/28/20 1814 136/82     Pulse Rate 08/28/20 1813 63     Resp 08/28/20 1813 18     Temp 08/28/20 1813 98.5 F (36.9 C)     Temp Source 08/28/20 1813 Oral     SpO2 08/28/20 1813 100 %     Weight --      Height --      Head Circumference --      Peak  Flow --      Pain Score 08/28/20 1812 10     Pain Loc --      Pain Edu? --      Excl. in GC? --    No data found.  Updated Vital Signs BP 136/82   Pulse 63   Temp 98.5 F (36.9 C) (Oral)   Resp 18   SpO2 100%   Visual Acuity Right Eye Distance:   Left Eye Distance:   Bilateral Distance:    Right Eye Near:   Left Eye Near:    Bilateral Near:     Physical Exam Vitals and nursing note reviewed.  Constitutional:      General: She is in acute distress.     Appearance: She is not ill-appearing.  Cardiovascular:     Rate and Rhythm: Normal rate and regular rhythm.  Musculoskeletal:     Comments: Tenderness to palpation on the left sacroiliac joint with left lumbar paraspinal muscle tenderness.  Deep tendon reflex is 1+ in both lower extremities.  Neurological:     Mental Status: She is alert.     UC Treatments / Results  Labs (all labs ordered are listed, but only abnormal results are displayed) Labs Reviewed -  No data to display  EKG   Radiology No results found.  Procedures Procedures (including critical care time)  Medications Ordered in UC Medications  ketorolac (TORADOL) 30 MG/ML injection 30 mg (30 mg Intramuscular Given 08/28/20 1844)    Initial Impression / Assessment and Plan / UC Course  I have reviewed the triage vital signs and the nursing notes.  Pertinent labs & imaging results that were available during my care of the patient were reviewed by me and considered in my medical decision making (see chart for details).    1.  Left-sided sciatica: Heating pad use is advised Gentle range of motion exercises Toradol 30 mg IM x1 dose Ibuprofen 600 mg every 6 hours as needed for pain Zanaflex 4 mg at bedtime as needed for muscle spasm. Return precautions given No indication for imaging at this time.  Final Clinical Impressions(s) / UC Diagnoses   Final diagnoses:  Left sided sciatica     Discharge Instructions      Entered range of motion exercises Take medications as prescribed Heating pad on a 10 minutes on-10 minutes off cycle If symptoms worsen please return to the urgent care There is no indication for x-ray of your back.     ED Prescriptions     Medication Sig Dispense Auth. Provider   ibuprofen (ADVIL) 600 MG tablet Take 1 tablet (600 mg total) by mouth every 6 (six) hours as needed. 30 tablet Tresten Pantoja, Britta Mccreedy, MD   tiZANidine (ZANAFLEX) 4 MG tablet Take 1 tablet (4 mg total) by mouth at bedtime as needed for muscle spasms. 15 tablet Kamaal Cast, Britta Mccreedy, MD      PDMP not reviewed this encounter.   Merrilee Jansky, MD 08/29/20 1739
